# Patient Record
Sex: Male | Born: 1990 | Race: Black or African American | Hispanic: No | Marital: Single | Smoking: Current every day smoker
Health system: Southern US, Community
[De-identification: ages and names within clinical notes are randomized; demographics above are authoritative.]

## PROBLEM LIST (undated history)

## (undated) HISTORY — PX: TONSILLECTOMY: SUR1361

## (undated) HISTORY — PX: ABDOMINAL SURGERY: SHX537

---
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
## 2012-12-08 ENCOUNTER — Emergency Department: Payer: Self-pay | Admitting: Emergency Medicine

## 2013-06-21 ENCOUNTER — Emergency Department: Payer: Self-pay | Admitting: Emergency Medicine

## 2014-04-03 ENCOUNTER — Emergency Department: Payer: Self-pay | Admitting: Emergency Medicine

## 2014-07-28 ENCOUNTER — Encounter: Payer: Self-pay | Admitting: Urgent Care

## 2014-07-28 ENCOUNTER — Emergency Department
Admission: EM | Admit: 2014-07-28 | Discharge: 2014-07-28 | Disposition: A | Discharge status: Home / Self Care | Payer: No Typology Code available for payment source | Attending: Emergency Medicine | Admitting: Emergency Medicine

## 2014-07-28 DIAGNOSIS — K625 Hemorrhage of anus and rectum: Secondary | ICD-10-CM | POA: Diagnosis present

## 2014-07-28 DIAGNOSIS — Z720 Tobacco use: Secondary | ICD-10-CM | POA: Diagnosis not present

## 2014-07-28 NOTE — ED Provider Notes (Signed)
[facility] Emergency Department Provider Note ____________________________________________  Time seen: Approximately 11:16 PM  I have reviewed the triage vital signs and the nursing notes.   HISTORY  Chief Complaint Rectal Bleeding   HPI Allen Cisneros is a 24 y.o. male presents to the emergency department for one episode of rectal bleeding earlier today. He states that he was straining to have a bowel movement and when he passed stool noticed dark red blood in the toilet. He then had a small amount of light red blood on the toilet paper when he wiped. He denies having a history of rectal bleeding. He denies history of hemorrhoids. He denies abdominal or rectal pain at this time.  History reviewed. No pertinent past medical history.  There are no active problems to display for this patient.   Past Surgical History  Procedure Laterality Date  . Tonsillectomy      No current outpatient prescriptions on file.  Allergies Review of patient's allergies indicates no known allergies.  No family history on file.  Social History History  Substance Use Topics  . Smoking status: Current Every Day Smoker  . Smokeless tobacco: Not on file  . Alcohol Use: No    Review of Systems Constitutional: No fever/chills Eyes: No visual changes. ENT: No sore throat. Cardiovascular: Denies chest pain. Respiratory: Denies shortness of breath. Gastrointestinal: No abdominal pain.  No nausea, no vomiting.  No diarrhea.  Dark red blood in the toilet earlier and light red blood on the toilet paper. Genitourinary: Negative for dysuria. Musculoskeletal: Negative for back pain. Skin: Negative for rash. Neurological: Negative for headaches, focal weakness or numbness.  10-point ROS otherwise negative.  ____________________________________________   PHYSICAL EXAM:  VITAL SIGNS: ED Triage Vitals  Enc Vitals Group     BP 07/28/14 2205 128/80 mmHg     Pulse Rate  07/28/14 2205 84     Resp 07/28/14 2205 18     Temp 07/28/14 2205 98.6 F (37 C)     Temp Source 07/28/14 2205 Oral     SpO2 07/28/14 2205 99 %     Weight 07/28/14 2205 160 lb (72.576 kg)     Height 07/28/14 2205  (1.905 m)     Head Cir --      Peak Flow --      Pain Score 07/28/14 2206 0     Pain Loc --      Pain Edu? --      Excl. in GC? --     Constitutional: Alert and oriented. Well appearing and in no acute distress. Eyes: Conjunctivae are normal. PERRL. EOMI. Head: Atraumatic. Nose: No congestion/rhinnorhea. Mouth/Throat: Mucous membranes are moist.  Oropharynx non-erythematous. Neck: No stridor.   Cardiovascular: Normal rate, regular rhythm. Grossly normal heart sounds.  Good peripheral circulation. Respiratory: Normal respiratory effort.  No retractions. Lungs CTAB. Gastrointestinal: Soft and nontender. No distention. No abdominal bruits. Rectal: No external hemorrhoids. No obvious fissures. No blood present on exam.  Musculoskeletal: No lower extremity tenderness nor edema.  No joint effusions. Neurologic:  Normal speech and language. No gross focal neurologic deficits are appreciated. Speech is normal. No gait instability. Skin:  Skin is warm, dry and intact. No rash noted. Psychiatric: Mood and affect are normal. Speech and behavior are normal.  ____________________________________________   LABS (all labs ordered are listed, but only abnormal results are displayed)  Labs Reviewed - No data to display ____________________________________________  EKG   ____________________________________________  RADIOLOGY   ____________________________________________  PROCEDURES  Procedure(s) performed: Hemoccult: Negative  Critical Care performed:  ____________________________________________   INITIAL IMPRESSION / ASSESSMENT AND PLAN / ED COURSE  Pertinent labs & imaging results that were available during my care of the patient were reviewed by me and  considered in my medical decision making (see chart for details).  Patient was advised to follow up with the gastroenterologist if bleeding returns. He was advised to increase the water and fiber in his diet and to avoid straining to have a bowel movement.  ____________________________________________   FINAL CLINICAL IMPRESSION(S) / ED DIAGNOSES  Final diagnoses:  Rectal bleeding      Chinita Pester, FNP 07/28/14 0981  Maurilio Lovely, MD 07/28/14 2352

## 2014-07-28 NOTE — Discharge Instructions (Signed)
Bloody Stools  Bloody stools often mean that there is a problem in the digestive tract. Your caregiver may use the term "melena" to describe black, tarry, and bad smelling stools or "hematochezia" to describe red or maroon-colored stools. Blood seen in the stool can be caused by bleeding anywhere along the intestinal tract.   A black stool usually means that blood is coming from the upper part of the gastrointestinal tract (esophagus, stomach, or small bowel). Passing maroon-colored stools or bright red blood usually means that blood is coming from lower down in the large bowel or the rectum. However, sometimes massive bleeding in the stomach or small intestine can cause bright red bloody stools.   Consuming black licorice, lead, iron pills, medicines containing bismuth subsalicylate, or blueberries can also cause black stools. Your caregiver can test black stools to see if blood is present.  It is important that the cause of the bleeding be found. Treatment can then be started, and the problem can be corrected. Rectal bleeding may not be serious, but you should not assume everything is okay until you know the cause. It is very important to follow up with your caregiver or a specialist in gastrointestinal problems.  CAUSES   Blood in the stools can come from various underlying causes. Often, the cause is not found during your first visit. Testing is often needed to discover the cause of bleeding in the gastrointestinal tract. Causes range from simple to serious or even life-threatening. Possible causes include:  · Hemorrhoids. These are veins that are full of blood (engorged) in the rectum. They cause pain, inflammation, and may bleed.  · Anal fissures. These are areas of painful tearing which may bleed. They are often caused by passing hard stool.  · Diverticulosis. These are pouches that form on the colon over time, with age, and may bleed significantly.  · Diverticulitis. This is inflammation in areas with  diverticulosis. It can cause pain, fever, and bloody stools, although bleeding is rare.  · Proctitis and colitis. These are inflamed areas of the rectum or colon. They may cause pain, fever, and bloody stools.  · Polyps and cancer. Colon cancer is a leading cause of preventable cancer death. It often starts out as precancerous polyps that can be removed during a colonoscopy, preventing progression into cancer. Sometimes, polyps and cancer may cause rectal bleeding.  · Gastritis and ulcers. Bleeding from the upper gastrointestinal tract (near the stomach) may travel through the intestines and produce black, sometimes tarry, often bad smelling stools. In certain cases, if the bleeding is fast enough, the stools may not be black, but red and the condition may be life-threatening.  SYMPTOMS   You may have stools that are bright red and bloody, that are normal color with blood on them, or that are dark black and tarry. In some cases, you may only have blood in the toilet bowl. Any of these cases need medical care. You may also have:  · Pain at the anus or anywhere in the rectum.  · Lightheadedness or feeling faint.  · Extreme weakness.  · Nausea or vomiting.  · Fever.  DIAGNOSIS  Your caregiver may use the following methods to find the cause of your bleeding:  · Taking a medical history. Age is important. Older people tend to develop polyps and cancer more often. If there is anal pain and a hard, large stool associated with bleeding, a tear of the anus may be the cause. If blood drips into the toilet after a bowel movement, bleeding hemorrhoids may be the   problem. The color and frequency of the bleeding are additional considerations. In most cases, the medical history provides clues, but seldom the final answer.  · A visual and finger (digital) exam. Your caregiver will inspect the anal area, looking for tears and hemorrhoids. A finger exam can provide information when there is tenderness or a growth inside. In men, the  prostate is also examined.  · Endoscopy. Several types of small, long scopes (endoscopes) are used to view the colon.  ¨ In the office, your caregiver may use a rigid, or more commonly, a flexible viewing sigmoidoscope. This exam is called flexible sigmoidoscopy. It is performed in 5 to 10 minutes.  ¨ A more thorough exam is accomplished with a colonoscope. It allows your caregiver to view the entire 5 to 6 foot long colon. Medicine to help you relax (sedative) is usually given for this exam. Frequently, a bleeding lesion may be present beyond the reach of the sigmoidoscope. So, a colonoscopy may be the best exam to start with. Both exams are usually done on an outpatient basis. This means the patient does not stay overnight in the hospital or surgery center.  ¨ An upper endoscopy may be needed to examine your stomach. Sedation is used and a flexible endoscope is put in your mouth, down to your stomach.  · A barium enema X-ray. This is an X-ray exam. It uses liquid barium inserted by enema into the rectum. This test alone may not identify an actual bleeding point. X-rays highlight abnormal shadows, such as those made by lumps (tumors), diverticuli, or colitis.  TREATMENT   Treatment depends on the cause of your bleeding.   · For bleeding from the stomach or colon, the caregiver doing your endoscopy or colonoscopy may be able to stop the bleeding as part of the procedure.  · Inflammation or infection of the colon can be treated with medicines.  · Many rectal problems can be treated with creams, suppositories, or warm baths.  · Surgery is sometimes needed.  · Blood transfusions are sometimes needed if you have lost a lot of blood.  · For any bleeding problem, let your caregiver know if you take aspirin or other blood thinners regularly.  HOME CARE INSTRUCTIONS   · Take any medicines exactly as prescribed.  · Keep your stools soft by eating a diet high in fiber. Prunes (1 to 3 a day) work well for many people.  · Drink  enough water and fluids to keep your urine clear or pale yellow.  · Take sitz baths if advised. A sitz bath is when you sit in a bathtub with warm water for 10 to 15 minutes to soak, soothe, and cleanse the rectal area.  · If enemas or suppositories are advised, be sure you know how to use them. Tell your caregiver if you have problems with this.  · Monitor your bowel movements to look for signs of improvement or worsening.  SEEK MEDICAL CARE IF:   · You do not improve in the time expected.  · Your condition worsens after initial improvement.  · You develop any new symptoms.  SEEK IMMEDIATE MEDICAL CARE IF:   · You develop severe or prolonged rectal bleeding.  · You vomit blood.  · You feel weak or faint.  · You have a fever.  MAKE SURE YOU:  · Understand these instructions.  · Will watch your condition.  · Will get help right away if you are not doing well or get worse.    Document Released: 01/21/2002 Document Revised: 04/25/2011 Document Reviewed: 06/18/2010  [employer]® Patient Information ©2015 [employer]. This information is not intended to replace advice given to you by your health care provider. Make sure you discuss any questions you have with your health care provider.

## 2014-07-28 NOTE — ED Notes (Signed)
Patient presents reporting that he passed BRBPR tonight while at work. Patient advises that he was "straining real hard" to have a BM and appreciated the blood following when he wiped. Denies rectal and abd pain.

## 2014-07-28 NOTE — ED Notes (Signed)
Pt reports having a bowel movement around 7pm today and noticed BRBPR after finishing. Pt denies any recent opoid use; denies any previous issues with constipation. Pt denies any knowledge of a family history of colon cancer or hemorrhoids. Pt denies any lightheadedness or  weakness following the incident. Pt is A&O, in NAD, with s/o at bedside.

## 2014-09-05 ENCOUNTER — Other Ambulatory Visit: Payer: Self-pay

## 2014-09-05 ENCOUNTER — Emergency Department
Admission: EM | Admit: 2014-09-05 | Discharge: 2014-09-05 | Disposition: A | Discharge status: Home / Self Care | Payer: Self-pay | Attending: Emergency Medicine | Admitting: Emergency Medicine

## 2014-09-05 ENCOUNTER — Encounter: Payer: Self-pay | Admitting: Emergency Medicine

## 2014-09-05 ENCOUNTER — Emergency Department: Payer: Self-pay

## 2014-09-05 DIAGNOSIS — R079 Chest pain, unspecified: Secondary | ICD-10-CM | POA: Insufficient documentation

## 2014-09-05 DIAGNOSIS — Z720 Tobacco use: Secondary | ICD-10-CM | POA: Insufficient documentation

## 2014-09-05 LAB — CBC
HCT: 45.8 % (ref 40.0–52.0)
HEMOGLOBIN: 15.1 g/dL (ref 13.0–18.0)
MCH: 27.1 pg (ref 26.0–34.0)
MCHC: 33 g/dL (ref 32.0–36.0)
MCV: 82.1 fL (ref 80.0–100.0)
PLATELETS: 205 10*3/uL (ref 150–440)
RBC: 5.58 MIL/uL (ref 4.40–5.90)
RDW: 13.3 % (ref 11.5–14.5)
WBC: 4.5 10*3/uL (ref 3.8–10.6)

## 2014-09-05 LAB — BASIC METABOLIC PANEL
ANION GAP: 8 (ref 5–15)
BUN: 9 mg/dL (ref 6–20)
CO2: 27 mmol/L (ref 22–32)
Calcium: 9.4 mg/dL (ref 8.9–10.3)
Chloride: 102 mmol/L (ref 101–111)
Creatinine, Ser: 1.12 mg/dL (ref 0.61–1.24)
GFR calc non Af Amer: >60 mL/min (ref >60)
Glucose, Bld: 99 mg/dL (ref 65–99)
POTASSIUM: 3.9 mmol/L (ref 3.5–5.1)
Sodium: 137 mmol/L (ref 135–145)

## 2014-09-05 LAB — TROPONIN I

## 2014-09-05 MED ORDER — NAPROXEN 500 MG PO TABS: 500.0000 mg | ORAL_TABLET | Freq: Two times a day (BID) | ORAL | Status: DC

## 2014-09-05 NOTE — ED Notes (Signed)
CP typically at night but occurred yesterday while pt was at work. CP this AM, sharp

## 2014-09-05 NOTE — ED Notes (Signed)
Pt to ed with c/o left side chest pain, intermittent x 4 weeks. Pt states pain is sharp at times and he feels sob and dizzy.  Pt was seen here in ed for same about 1 month ago but states "they couldn't find anything wrong"

## 2014-09-05 NOTE — ED Notes (Signed)
Pt alert and oriented X4, active, cooperative, pt in NAD. RR even and unlabored, color WNL.  Pt informed to return if any life threatening symptoms occur.   

## 2014-09-05 NOTE — ED Notes (Signed)
Pt to ED c/o L sided chest pain that radiates to the R side of chest and to the back. Pt states he was seen in the ED earlier today.

## 2014-09-05 NOTE — ED Provider Notes (Signed)
[facility] Emergency Department Provider Note  ____________________________________________  Time seen: On arrival  I have reviewed the triage vital signs and the nursing notes.   HISTORY  Chief Complaint Chest Pain    HPI Allen Cisneros is a 24 y.o. male who reports occasional/intermittent sharp chest pain that is mild to moderate in his left breast. This has happened twice over the last month. He reports usually it happens after standing up at his job all night long. No shortness of breath. He does smoke cigarettes. He has no history of heart attacks at young ages family. No fevers no chills. No recent travel. No history of DVTs. No recent surgery.     History reviewed. No pertinent past medical history.  There are no active problems to display for this patient.   Past Surgical History  Procedure Laterality Date  . Tonsillectomy      No current outpatient prescriptions on file.  Allergies Review of patient's allergies indicates no known allergies.  History reviewed. No pertinent family history.  Social History History  Substance Use Topics  . Smoking status: Current Every Day Smoker  . Smokeless tobacco: Not on file  . Alcohol Use: No    Review of Systems  Constitutional: Negative for fever. Eyes: Negative for visual changes. ENT: Negative for sore throat Cardiovascular: Positive for chest pain Respiratory: Negative for shortness of breath. Gastrointestinal: Negative for abdominal pain, vomiting and diarrhea. Genitourinary: Negative for dysuria. Musculoskeletal: Negative for back pain. Skin: Negative for rash. Neurological: Negative for headaches or focal weakness Psychiatric: No anxiety  10-point ROS otherwise negative.  ____________________________________________   PHYSICAL EXAM:  VITAL SIGNS: ED Triage Vitals  Enc Vitals Group     BP 09/05/14 0835 150/94 mmHg     Pulse Rate 09/05/14 0835 51     Resp 09/05/14 0835  20     Temp 09/05/14 0835 97.8 F (36.6 C)     Temp Source 09/05/14 0835 Oral     SpO2 09/05/14 0835 100 %     Weight 09/05/14 0835 155 lb (70.308 kg)     Height 09/05/14 0835  (1.905 m)     Head Cir --      Peak Flow --      Pain Score 09/05/14 0836 3     Pain Loc --      Pain Edu? --      Excl. in GC? --      Constitutional: Alert and oriented. Well appearing and in no distress. Eyes: Conjunctivae are normal.  ENT   Head: Normocephalic and atraumatic.   Mouth/Throat: Mucous membranes are moist. Cardiovascular: Normal rate, regular rhythm. Normal and symmetric distal pulses are present in all extremities. No murmurs, rubs, or gallops. Respiratory: Normal respiratory effort without tachypnea nor retractions. Breath sounds are clear and equal bilaterally.  Gastrointestinal: Soft and non-tender in all quadrants. No distention. There is no CVA tenderness. Genitourinary: deferred Musculoskeletal: Nontender with normal range of motion in all extremities. No lower extremity tenderness nor edema. Neurologic:  Normal speech and language. No gross focal neurologic deficits are appreciated. Skin:  Skin is warm, dry and intact. No rash noted. Psychiatric: Mood and affect are normal. Patient exhibits appropriate insight and judgment.  ____________________________________________    LABS (pertinent positives/negatives)  Labs Reviewed - No data to display  ____________________________________________   EKG  ED ECG REPORT I, Jene Every, the attending physician, personally viewed and interpreted this ECG.   Date: 09/05/2014  EKG Time:  8:35 AM  Rate: 51  Rhythm: sinus bradycardia  Axis: Normal  Intervals:none  ST&T Change: None   ____________________________________________    RADIOLOGY I have personally reviewed any xrays that were ordered on this patient: None ____________________________________________   PROCEDURES  Procedure(s) performed:  none  Critical Care performed: none  ____________________________________________   INITIAL IMPRESSION / ASSESSMENT AND PLAN / ED COURSE  Pertinent labs & imaging results that were available during my care of the patient were reviewed by me and considered in my medical decision making (see chart for details).  Patient well-appearing and in no distress. He is quite comfortable in the emergency department. 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 His history of present illness is not consistent with ACS nor a DVT. He is perc negative. He has mild hypertension for which he will follow-up with his primary care physician. I do not feel blood work is necessary or will add anything to his care today. Have asked him follow-up with his PCP as mentioned  ____________________________________________   FINAL CLINICAL IMPRESSION(S) / ED DIAGNOSES  Final diagnoses:  Chest pain, unspecified chest pain type     Jene Every, MD 09/05/14 1500

## 2014-09-05 NOTE — Discharge Instructions (Signed)

## 2014-09-05 NOTE — ED Notes (Signed)
MD at bedside. 

## 2014-09-06 ENCOUNTER — Emergency Department
Admission: EM | Admit: 2014-09-06 | Discharge: 2014-09-06 | Disposition: A | Discharge status: Home / Self Care | Payer: Self-pay | Attending: Emergency Medicine | Admitting: Emergency Medicine

## 2014-09-06 DIAGNOSIS — R079 Chest pain, unspecified: Secondary | ICD-10-CM

## 2014-09-06 LAB — FIBRIN DERIVATIVES D-DIMER (ARMC ONLY): FIBRIN DERIVATIVES D-DIMER (ARMC): 99 (ref 0–499)

## 2014-09-06 NOTE — ED Provider Notes (Signed)
[facility] Emergency Department Provider Note  Time seen: 5:20 AM  I have reviewed the triage vital signs and the nursing notes.   HISTORY  Chief Complaint Chest Pain    HPI Allen Cisneros is a 24 y.o. male with no past medical history who presents the emergency department for intermittent chest pain times one month. According to the patient for the past one month he has intermittently had chest pain, she describes as sharp and central. Patient states occasionally worse when he takes a deep breath or cough. Denies any leg swelling or pain. Denies any history of blood clots. The patient was seen for a similar presentation yesterday, but no labs were done at that time. Patient was follow up with his primary care doctor, but he states he has continued to have intermittent chest pain so he came to the ER for evaluation again.Describes his chest and a sharp, moderate when it does occur, but it is intermittent, he is largely pain free between episodes.    No past medical history on file.  There are no active problems to display for this patient.   Past Surgical History  Procedure Laterality Date  . Tonsillectomy      Current Outpatient Rx  Name  Route  Sig  Dispense  Refill  . acetaminophen (TYLENOL) 500 MG tablet   Oral   Take 500 mg by mouth every 6 (six) hours as needed for mild pain.         . naproxen (NAPROSYN) 500 MG tablet   Oral   Take 1 tablet (500 mg total) by mouth 2 (two) times daily with a meal.   20 tablet   2     Allergies Review of patient's allergies indicates no known allergies.  No family history on file.  Social History History  Substance Use Topics  . Smoking status: Current Every Day Smoker  . Smokeless tobacco: Not on file  . Alcohol Use: No    Review of Systems Constitutional: Negative for fever. Cardiovascular: Positive for chest pain, intermittent Respiratory: Negative for shortness of  breath. Gastrointestinal: Negative for abdominal pain, vomiting and diarrhea. Neurological: Negative for headache 10-point ROS otherwise negative.  ____________________________________________   PHYSICAL EXAM:  VITAL SIGNS: ED Triage Vitals  Enc Vitals Group     BP 09/05/14 2250 128/78 mmHg     Pulse Rate 09/05/14 2250 53     Resp 09/05/14 2250 18     Temp 09/05/14 2250 97.6 F (36.4 C)     Temp Source 09/05/14 2250 Oral     SpO2 09/05/14 2250 99 %     Weight 09/05/14 2250 150 lb (68.04 kg)     Height 09/05/14 2250  (1.905 m)     Head Cir --      Peak Flow --      Pain Score 09/05/14 2251 7     Pain Loc --      Pain Edu? --      Excl. in GC? --     Constitutional: Alert and oriented. Well appearing and in no distress. ENT   Mouth/Throat: Mucous membranes are moist. Cardiovascular: Normal rate, regular rhythm, bradycardic at times. Patient does have a constant cardiac murmur. Respiratory: Normal respiratory effort without tachypnea nor retractions. Breath sounds are clear and equal bilaterally. No wheezes/rales/rhonchi. Gastrointestinal: Soft and nontender. No distention.  Musculoskeletal: Nontender with normal range of motion in all extremities. No lower extremity tenderness or edema. Neurologic:  Normal speech  and language. No gross focal neurologic deficits Skin:  Skin is warm, dry and intact.  Psychiatric: Mood and affect are normal. Speech and behavior are normal.   ____________________________________________    EKG  EKG reviewed and interpreted by myself shows normal sinus rhythm at 59 bpm, narrow QRS, rightward axis, normal intervals, patient does have nonspecific ST changes present, but no ST elevations noted.  ____________________________________________    RADIOLOGY  Chest x-ray within normal limits  ____________________________________________    INITIAL IMPRESSION / ASSESSMENT AND PLAN / ED COURSE  Pertinent labs & imaging results that  were available during my care of the patient were reviewed by me and considered in my medical decision making (see chart for details).  Patient presents for intermittent chest pain times close to one month. Patient is labs including cardiac enzyme is within normal limits. EKG is abnormal, but no acute/concerning findings. Chest x-ray is normal. Given the patient's central chest pain, we'll proceed with a d-dimer to help further evaluate. The patient does have a fairly constant murmur. He states he is aware of this, and he had an ultrasound of his heart done approximately 4-5 years ago, but he cannot remember the name of the cardiologist. He states he was told that it was nothing to worry about.  D-dimer is negative. I discussed this with the patient, and the need for him to follow up with a local cardiologist. We will refer to Dr.Khan. Patient is agreeable to follow up with cardiology. Discussed with the patient strict chest pain return precautions to which he is agreeable.  ____________________________________________   FINAL CLINICAL IMPRESSION(S) / ED DIAGNOSES  Chest pain   Minna Antis, MD 09/06/14 [phone]

## 2014-09-06 NOTE — Discharge Instructions (Signed)
You have been seen in the emergency department today for chest pain. Your workup including EKG, and labs do not show any acute abnormalities. As we have discussed you do have a murmur on exam, and we recommend you follow-up with a cardiologist by calling the number provided as soon as possible for further evaluation and workup. Return to the emergency department if you have any further/increased chest pain, trouble breathing, or any other symptom personally concerning to yourself.    Chest Pain (Nonspecific) It is often hard to give a diagnosis for the cause of chest pain. There is always a chance that your pain could be related to something serious, such as a heart attack or a blood clot in the lungs. You need to follow up with your doctor. HOME CARE  If antibiotic medicine was given, take it as directed by your doctor. Finish the medicine even if you start to feel better.  For the next few days, avoid activities that bring on chest pain. Continue physical activities as told by your doctor.  Do not use any tobacco products. This includes cigarettes, chewing tobacco, and e-cigarettes.  Avoid drinking alcohol.  Only take medicine as told by your doctor.  Follow your doctor's suggestions for more testing if your chest pain does not go away.  Keep all doctor visits you made. GET HELP IF:  Your chest pain does not go away, even after treatment.  You have a rash with blisters on your chest.  You have a fever. GET HELP RIGHT AWAY IF:   You have more pain or pain that spreads to your arm, neck, jaw, back, or belly (abdomen).  You have shortness of breath.  You cough more than usual or cough up blood.  You have very bad back or belly pain.  You feel sick to your stomach (nauseous) or throw up (vomit).  You have very bad weakness.  You pass out (faint).  You have chills. This is an emergency. Do not wait to see if the problems will go away. Call your local emergency services (911  in [place] Do not drive yourself to the hospital. MAKE SURE YOU:   Understand these instructions.  Will watch your condition.  Will get help right away if you are not doing well or get worse. Document Released: 07/20/2007 Document Revised: 02/05/2013 Document Reviewed: 07/20/2007 [facility] Patient Information 2015 [place], [place]. This information is not intended to replace advice given to you by your health care provider. Make sure you discuss any questions you have with your health care provider.

## 2014-10-23 ENCOUNTER — Emergency Department
Admission: EM | Admit: 2014-10-23 | Discharge: 2014-10-23 | Disposition: A | Discharge status: Home / Self Care | Payer: Self-pay | Attending: Emergency Medicine | Admitting: Emergency Medicine

## 2014-10-23 ENCOUNTER — Encounter: Payer: Self-pay | Admitting: Emergency Medicine

## 2014-10-23 DIAGNOSIS — Z720 Tobacco use: Secondary | ICD-10-CM | POA: Insufficient documentation

## 2014-10-23 DIAGNOSIS — R350 Frequency of micturition: Secondary | ICD-10-CM | POA: Insufficient documentation

## 2014-10-23 DIAGNOSIS — Z791 Long term (current) use of non-steroidal anti-inflammatories (NSAID): Secondary | ICD-10-CM | POA: Insufficient documentation

## 2014-10-23 LAB — URINALYSIS COMPLETE WITH MICROSCOPIC (ARMC ONLY)
Bacteria, UA: NONE SEEN
Bilirubin Urine: NEGATIVE
Glucose, UA: NEGATIVE mg/dL
Hgb urine dipstick: NEGATIVE
Ketones, ur: NEGATIVE mg/dL
Leukocytes, UA: NEGATIVE
Nitrite: NEGATIVE
PROTEIN: NEGATIVE mg/dL
SQUAMOUS EPITHELIAL / LPF: NONE SEEN
Specific Gravity, Urine: 1.004 — ABNORMAL LOW (ref 1.005–1.030)
pH: 6 (ref 5.0–8.0)

## 2014-10-23 LAB — CHLAMYDIA/NGC RT PCR (ARMC ONLY)
CHLAMYDIA TR: NOT DETECTED
N gonorrhoeae: NOT DETECTED

## 2014-10-23 NOTE — ED Notes (Signed)
Patient to ED with report of urinary frequency over the last few weeks. Denies pain or burning and reports a normal urine stream.

## 2014-10-23 NOTE — ED Provider Notes (Signed)
[facility] Emergency Department Provider Note  ____________________________________________  Time seen: Approximately 9:14 AM  I have reviewed the triage vital signs and the nursing notes.   HISTORY  Chief Complaint Urinary Frequency  HPI Allen Cisneros is a 24 y.o. male is here with complaint of urinary frequency over the last few weeks. Patient denies any pain or burning. He states his stream is normal. He gives a history of several weeks ago being treated for gonorrhea. Both he and his girlfriend retreated. He denies any discharge. He denies any history of diabetes or kidney stones. He denies any fever or chills, no nausea or vomiting. Currently his pain is 0 out of 10. He states that he has been working outside, has been drinking a lot of fluid which may explain his frequency. He denies any prostate problems in the past and currently denies any back pain.  History reviewed. No pertinent past medical history.  There are no active problems to display for this patient.   Past Surgical History  Procedure Laterality Date  . Tonsillectomy      Current Outpatient Rx  Name  Route  Sig  Dispense  Refill  . acetaminophen (TYLENOL) 500 MG tablet   Oral   Take 500 mg by mouth every 6 (six) hours as needed for mild pain.         . naproxen (NAPROSYN) 500 MG tablet   Oral   Take 1 tablet (500 mg total) by mouth 2 (two) times daily with a meal.   20 tablet   2     Allergies Review of patient's allergies indicates no known allergies.  History reviewed. No pertinent family history.  Social History Social History  Substance Use Topics  . Smoking status: Current Every Day Smoker  . Smokeless tobacco: None  . Alcohol Use: No    Review of Systems Constitutional: No fever/chills Eyes: No visual changes. ENT: No sore throat. Cardiovascular: Denies chest pain. Respiratory: Denies shortness of breath. Gastrointestinal: No abdominal pain.  No nausea,  no vomiting.   Genitourinary: Positive for frequency Musculoskeletal: Negative for back pain. Skin: Negative for rash. Neurological: Negative for headaches, focal weakness or numbness.  10-point ROS otherwise negative.  ____________________________________________   PHYSICAL EXAM:  VITAL SIGNS: ED Triage Vitals  Enc Vitals Group     BP 10/23/14 0756 131/85 mmHg     Pulse Rate 10/23/14 0756 54     Resp 10/23/14 0756 18     Temp 10/23/14 0756 98.4 F (36.9 C)     Temp Source 10/23/14 0756 Oral     SpO2 10/23/14 0756 100 %     Weight 10/23/14 0756 165 lb (74.844 kg)     Height 10/23/14 0756  (1.905 m)     Head Cir --      Peak Flow --      Pain Score --      Pain Loc --      Pain Edu? --      Excl. in GC? --     Constitutional: Alert and oriented. Well appearing and in no acute distress. Eyes: Conjunctivae are normal. PERRL. EOMI. Head: Atraumatic. Nose: No congestion/rhinnorhea. Neck: No stridor.   Cardiovascular: Normal rate, regular rhythm. Grossly normal heart sounds.  Good peripheral circulation. Respiratory: Normal respiratory effort.  No retractions. Lungs CTAB. Gastrointestinal: Soft and nontender. No distention.  No CVA tenderness. Musculoskeletal: No lower extremity tenderness nor edema.  No joint effusions. Neurologic:  Normal speech and  language. No gross focal neurologic deficits are appreciated. No gait instability. Skin:  Skin is warm, dry and intact. No rash noted. Psychiatric: Mood and affect are normal. Speech and behavior are normal.  ____________________________________________   LABS (all labs ordered are listed, but only abnormal results are displayed)  Labs Reviewed  URINALYSIS COMPLETEWITH MICROSCOPIC ([facility] ONLY) - Abnormal; Notable for the following:    Color, Urine STRAW (*)    APPearance CLEAR (*)    Specific Gravity, Urine 1.004 (*)    All other components within normal limits  CHLAMYDIA/NGC RT PCR ([facility] ONLY)     PROCEDURES  Procedure(s) performed: None  Critical Care performed: No  ____________________________________________   INITIAL IMPRESSION / ASSESSMENT AND PLAN / ED COURSE  Pertinent labs & imaging results that were available during my care of the patient were reviewed by me and considered in my medical decision making (see chart for details).  Patient was advised that his gonorrhea chlamydia test will not be back for several hours. We will call if the results are positive. ____________________________________________   FINAL CLINICAL IMPRESSION(S) / ED DIAGNOSES  Final diagnoses:  Urinary frequency      Tommi Rumps, PA-C 10/23/14 1006  Arnaldo Natal, MD 10/23/14 [phone]

## 2014-10-23 NOTE — Discharge Instructions (Signed)

## 2014-10-24 ENCOUNTER — Telehealth: Payer: Self-pay | Admitting: Emergency Medicine

## 2014-10-24 NOTE — ED Notes (Signed)
pateint calleda asking for urine test results.  i explained what was tested and that the tests for gc and chlamydia are negative.  i asked about plans for follow up and pt does not have pcp.  He was referred to [facility] and i explained.  He says he used to got to [facility].  i told him he could call there to see if they are taking patients.

## 2016-10-11 ENCOUNTER — Emergency Department
Admission: EM | Admit: 2016-10-11 | Discharge: 2016-10-11 | Disposition: A | Discharge status: Home / Self Care | Payer: Self-pay | Attending: Emergency Medicine | Admitting: Emergency Medicine

## 2016-10-11 ENCOUNTER — Encounter: Payer: Self-pay | Admitting: Emergency Medicine

## 2016-10-11 DIAGNOSIS — M62830 Muscle spasm of back: Secondary | ICD-10-CM

## 2016-10-11 DIAGNOSIS — M549 Dorsalgia, unspecified: Secondary | ICD-10-CM

## 2016-10-11 DIAGNOSIS — F17200 Nicotine dependence, unspecified, uncomplicated: Secondary | ICD-10-CM | POA: Insufficient documentation

## 2016-10-11 DIAGNOSIS — Z79899 Other long term (current) drug therapy: Secondary | ICD-10-CM | POA: Insufficient documentation

## 2016-10-11 MED ORDER — CYCLOBENZAPRINE HCL 10 MG PO TABS: 10.0000 mg | ORAL_TABLET | Freq: Three times a day (TID) | ORAL | 0 refills | Status: AC | PRN

## 2016-10-11 MED ORDER — KETOROLAC TROMETHAMINE 10 MG PO TABS: 10.0000 mg | ORAL_TABLET | Freq: Four times a day (QID) | ORAL | 0 refills | Status: AC | PRN

## 2016-10-11 MED ORDER — KETOROLAC TROMETHAMINE 30 MG/ML IJ SOLN
30.0000 mg | Freq: Once | INTRAMUSCULAR | Status: AC
  Administered 2016-10-11: 30 mg via INTRAMUSCULAR
  Filled 2016-10-11: qty 1

## 2016-10-11 NOTE — Discharge Instructions (Signed)
Take medication as prescribed. Return to emergency department if symptoms worsen and follow-up with PCP as needed.    Avoid heavy lifting for the next 24-48 hours until majority of symptoms improve.

## 2016-10-11 NOTE — ED Notes (Signed)
See triage note  States he developed pain to upper back on Saturday.  states he is unsure of injury but he has moved this weekend  Ambulates well

## 2016-10-11 NOTE — ED Provider Notes (Signed)
[facility] Emergency Department Provider Note   ____________________________________________   I have reviewed the triage vital signs and the nursing notes.   HISTORY  Chief Complaint Back Pain    HPI Allen Cisneros is a 26 y.o. male presents emergency Department with upper back pain he localizes over bilateral scapula that began to worsen this past Saturday. Patient reports he is moving from his home, in addition to, performing lifting, pushing and pulling during his work shift at the [employer]. Patient stated after getting off work this morning the pain had worsened to the point he felt he needed to come to the emergency department. Patient describes pain as constant along musculature around bilateral scapula with spasming and cramping. Patient denies fever, chills, headache, vision changes, chest pain, chest tightness, shortness of breath, abdominal pain, nausea and vomiting.  History reviewed. No pertinent past medical history.  There are no active problems to display for this patient.   Past Surgical History:  Procedure Laterality Date  . TONSILLECTOMY      Prior to Admission medications   Medication Sig Start Date End Date Taking? Authorizing Provider  acetaminophen (TYLENOL) 500 MG tablet Take 500 mg by mouth every 6 (six) hours as needed for mild pain.    [provider]  cyclobenzaprine (FLEXERIL) 10 MG tablet Take 1 tablet (10 mg total) by mouth 3 (three) times daily as needed for muscle spasms. 10/11/16   Lady Wisham M, PA-C  ketorolac (TORADOL) 10 MG tablet Take 1 tablet (10 mg total) by mouth every 6 (six) hours as needed. 10/11/16 10/16/16  Orissa Arreaga M, PA-C  naproxen (NAPROSYN) 500 MG tablet Take 1 tablet (500 mg total) by mouth 2 (two) times daily with a meal. 09/05/14   Jene Every, MD    Allergies Patient has no known allergies.  History reviewed. No pertinent family history.  Social History Social  History  Substance Use Topics  . Smoking status: Current Every Day Smoker  . Smokeless tobacco: Never Used  . Alcohol use No    Review of Systems Constitutional: Negative for fever/chills Eyes: No visual changes. ENT:  Negative for sore throat and for difficulty swallowing Cardiovascular: Denies chest pain. Respiratory: Denies cough. Denies shortness of breath. Gastrointestinal: No abdominal pain.  No nausea, vomiting, diarrhea. Genitourinary: Negative for dysuria. Musculoskeletal: Positive for mid back pain. Skin: Negative for rash. Neurological: Negative for headaches.  Negative focal weakness or numbness. Negative for loss of consciousness. Able to ambulate. ____________________________________________   PHYSICAL EXAM:  VITAL SIGNS: ED Triage Vitals  Enc Vitals Group     BP 10/11/16 0740 132/80     Pulse Rate 10/11/16 0739 87     Resp 10/11/16 0739 16     Temp 10/11/16 0739 98.2 F (36.8 C)     Temp Source 10/11/16 0739 Oral     SpO2 10/11/16 0739 100 %     Weight 10/11/16 0739 170 lb (77.1 kg)     Height 10/11/16 0739 6\' 3"  (1.905 m)     Head Circumference --      Peak Flow --      Pain Score 10/11/16 0739 8     Pain Loc --      Pain Edu? --      Excl. in GC? --     Constitutional: Alert and oriented. Well appearing and in no acute distress.  Eyes: Conjunctivae are normal. PERRL. EOMI  Head: Normocephalic and atraumatic. ENT:  Ears: Canals clear. TMs intact bilaterally.      Nose: No congestion/rhinnorhea.      Mouth/Throat: Mucous membranes are moist.  Neck:Supple. No thyromegaly. No stridor.  Cardiovascular: Normal rate, regular rhythm. Normal S1 and S2.  Good peripheral circulation. Respiratory: Normal respiratory effort without tachypnea or retractions. Lungs CTAB. No wheezes/rales/rhonchi. Good air entry to the bases with no decreased or absent breath sounds. Hematological/Lymphatic/Immunological: No cervical lymphadenopathy. Cardiovascular: Normal  rate, regular rhythm. Normal distal pulses. Gastrointestinal: Bowel sounds 4 quadrants. Soft and nontender to palpation. No guarding or rigidity. No palpable masses. No distention. No CVA tenderness. Musculoskeletal: Muscle pain and muscle spasms along bilateral trapezius, teres, rhomboids. Cervical and thoracic spine range of motion intact. No tenderness along spinous processes of the cervical or thoracic spine. No radicular symptoms reported. Intact sensation and strength along the bilateral upper extremity's. Nontender with normal range of motion in all extremities. Neurologic: Normal speech and language.  Skin:  Skin is warm, dry and intact. No rash noted. Psychiatric: Mood and affect are normal. Speech and behavior are normal. Patient exhibits appropriate insight and judgement.  ____________________________________________   LABS (all labs ordered are listed, but only abnormal results are displayed)  Labs Reviewed - No data to display ____________________________________________  EKG None ____________________________________________  RADIOLOGY None ____________________________________________   PROCEDURES  Procedure(s) performed: no    Critical Care performed: no ____________________________________________   INITIAL IMPRESSION / ASSESSMENT AND PLAN / ED COURSE  Pertinent labs & imaging results that were available during my care of the patient were reviewed by me and considered in my medical decision making (see chart for details).  Patient presents to emergency department with mid back pain for 3 days that has progressed with repetitive lifting of light to moderate heavy objects.. History and physical exam findings are reassuring symptoms are consistent with mild muscle strain and muscle spasms. Patient noted improvement of symptoms following Toradol given during the course of care in the emergency department. Patient will be prescribed a 5 day course of Toradol and  Flexeril as needed for muscle spasms. Patient advised to follow up with PCP as needed or return to the emergency department if symptoms return or worsen. Patient informed of clinical course, understand medical decision-making process, and agree with plan.   ____________________________________________   FINAL CLINICAL IMPRESSION(S) / ED DIAGNOSES  Final diagnoses:  Muscle spasm of back  Mid-back pain, acute       NEW MEDICATIONS STARTED DURING THIS VISIT:  New Prescriptions   CYCLOBENZAPRINE (FLEXERIL) 10 MG TABLET    Take 1 tablet (10 mg total) by mouth 3 (three) times daily as needed for muscle spasms.   KETOROLAC (TORADOL) 10 MG TABLET    Take 1 tablet (10 mg total) by mouth every 6 (six) hours as needed.     Note:  This document was prepared using Dragon voice recognition software and may include unintentional dictation errors.    Clois Comber, PA-C 10/11/16 2956    Emily Filbert, MD 10/11/16 0930

## 2016-10-11 NOTE — ED Triage Notes (Signed)
Pt c/o upper back pain Saturday.  Working has made pain worse, lifts heavy objects.  Pain worse with raising arms and when turns head.  Ambulatory to triage without difficulty.

## 2016-10-14 ENCOUNTER — Encounter: Payer: Self-pay | Admitting: Emergency Medicine

## 2016-10-14 ENCOUNTER — Emergency Department
Admission: EM | Admit: 2016-10-14 | Discharge: 2016-10-14 | Disposition: A | Discharge status: Home / Self Care | Payer: Self-pay | Attending: Emergency Medicine | Admitting: Emergency Medicine

## 2016-10-14 DIAGNOSIS — J069 Acute upper respiratory infection, unspecified: Secondary | ICD-10-CM | POA: Insufficient documentation

## 2016-10-14 DIAGNOSIS — F17200 Nicotine dependence, unspecified, uncomplicated: Secondary | ICD-10-CM | POA: Insufficient documentation

## 2016-10-14 LAB — POCT RAPID STREP A: Streptococcus, Group A Screen (Direct): NEGATIVE

## 2016-10-14 MED ORDER — CETIRIZINE HCL 10 MG PO CAPS: 10.0000 mg | ORAL_CAPSULE | Freq: Every day | ORAL | 3 refills | Status: AC

## 2016-10-14 MED ORDER — GUAIFENESIN-CODEINE 100-10 MG/5ML PO SOLN: 10.0000 mL | Freq: Three times a day (TID) | ORAL | 0 refills | Status: AC | PRN

## 2016-10-14 NOTE — ED Provider Notes (Signed)
[facility] Emergency Department Provider Note  ____________________________________________  Time seen: Approximately 8:53 AM  I have reviewed the triage vital signs and the nursing notes.   HISTORY  Chief Complaint Sore Throat    HPI Allen Cisneros is a 26 y.o. male who presents to the emergency department for evaluation and treatment of sore throat, cough, and runny nose for the past 2 weeks. He has only taken some type of pill that his sister gave him but is unsure of what that was exactly and states that it did not help him. He states he had a fever 2 days ago but none since that time. 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  History reviewed. No pertinent past medical history.  There are no active problems to display for this patient.   Past Surgical History:  Procedure Laterality Date  . TONSILLECTOMY      Prior to Admission medications   Medication Sig Start Date End Date Taking? Authorizing Provider  acetaminophen (TYLENOL) 500 MG tablet Take 500 mg by mouth every 6 (six) hours as needed for mild pain.    [provider]  Cetirizine HCl 10 MG CAPS Take 1 capsule (10 mg total) by mouth daily. 10/14/16   Milind Raether, Rulon Eisenmengerari B, FNP  cyclobenzaprine (FLEXERIL) 10 MG tablet Take 1 tablet (10 mg total) by mouth 3 (three) times daily as needed for muscle spasms. 10/11/16   Little, Traci M, PA-C  guaiFENesin-codeine 100-10 MG/5ML syrup Take 10 mLs by mouth 3 (three) times daily as needed. 10/14/16   Marcellius Montagna, Rulon Eisenmengerari B, FNP  ketorolac (TORADOL) 10 MG tablet Take 1 tablet (10 mg total) by mouth every 6 (six) hours as needed. 10/11/16 10/16/16  Little, Traci M, PA-C  naproxen (NAPROSYN) 500 MG tablet Take 1 tablet (500 mg total) by mouth 2 (two) times daily with a meal. 09/05/14   Jene EveryKinner, Robert, MD    Allergies Patient has no known allergies.  History reviewed. No pertinent family history.  Social History Social History   Substance Use Topics  . Smoking status: Current Every Day Smoker  . Smokeless tobacco: Never Used  . Alcohol use No    Review of Systems Constitutional: Negative for fever. Eyes: No visual changes. ENT: Positive for sore throat; negative for difficulty swallowing. Respiratory: Denies shortness of breath. Gastrointestinal: No abdominal pain.  No nausea, no vomiting.  No diarrhea.  Genitourinary: Negative for dysuria. Musculoskeletal: Negative for generalized body aches. Skin: Negative for rash. Neurological: Negative for headaches, negative  focal weakness or numbness.  ____________________________________________   PHYSICAL EXAM:  VITAL SIGNS: ED Triage Vitals  Enc Vitals Group     BP 10/14/16 0823 (!) 141/74     Pulse Rate 10/14/16 0823 77     Resp 10/14/16 0823 18     Temp 10/14/16 0823 97.8 F (36.6 C)     Temp Source 10/14/16 0823 Oral     SpO2 10/14/16 0823 100 %     Weight 10/14/16 0824 170 lb (77.1 kg)     Height 10/14/16 0824 6\' 3"  (1.905 m)     Head Circumference --      Peak Flow --      Pain Score 10/14/16 0823 10     Pain Loc --      Pain Edu? --      Excl. in GC? --    Constitutional: Alert and oriented. Well appearing and in no acute distress. Eyes: Conjunctivae  are normal.  Head: Atraumatic. Nose: No congestion/rhinnorhea. Mouth/Throat: Mucous membranes are moist.  Oropharynx Erythematous, tonsils 1+ without exudate. Uvula is midline. Neck: No stridor.  Lymphatic: Lymphadenopathy: No palpable anterior cervical lymphadenopathy Cardiovascular: Normal rate, regular rhythm. Good peripheral circulation. Respiratory: Normal respiratory effort. Lungs CTAB. Gastrointestinal: Soft and nontender. Musculoskeletal: No lower extremity tenderness nor edema.  Neurologic:  Normal speech and language. No gross focal neurologic deficits are appreciated. Speech is normal. No gait instability. Skin:  Skin is warm, dry and intact. No rash noted Psychiatric: Mood and  affect are normal. Speech and behavior are normal.  ____________________________________________   LABS (all labs ordered are listed, but only abnormal results are displayed)  Labs Reviewed  POCT RAPID STREP A   ____________________________________________  EKG  Not indicated ____________________________________________  RADIOLOGY  Not indicated ____________________________________________   PROCEDURES  Procedure(s) performed: None  Critical Care performed: No ____________________________________________   INITIAL IMPRESSION / ASSESSMENT AND PLAN / ED COURSE  26 year old male presenting to the emergency department after 2 weeks of sore throat, cough, and runny nose. Symptoms most consistent with URI. He will be treated with guaifenesin with codeine and encouraged to increase his fluid intake, rest, and follow-up with the primary care provider of his choice for symptoms that are not improving over the next few days. He was instructed to return to the emergency department for symptoms that change or worsen if he is unable to schedule an appointment.  Pertinent labs & imaging results that were available during my care of the patient were reviewed by me and considered in my medical decision making (see chart for details). ____________________________________________  New Prescriptions   CETIRIZINE HCL 10 MG CAPS    Take 1 capsule (10 mg total) by mouth daily.   GUAIFENESIN-CODEINE 100-10 MG/5ML SYRUP    Take 10 mLs by mouth 3 (three) times daily as needed.    FINAL CLINICAL IMPRESSION(S) / ED DIAGNOSES  Final diagnoses:  Viral upper respiratory tract infection    If controlled substance prescribed during this visit, 12 month history viewed on the [facility] prior to issuing an initial prescription for Schedule II or III opiod.   Note:  This document was prepared using Dragon voice recognition software and may include unintentional dictation errors.    Chinita Pester,  FNP 10/14/16 1610    Jene Every, MD 10/14/16 1146

## 2016-10-14 NOTE — ED Triage Notes (Signed)
Patient arrives to [facility] ED with c/o sore throat and cough for 2 weeks. +nasal drainage

## 2016-10-14 NOTE — Discharge Instructions (Signed)
Return to the ER for symptoms that change or worsen if unable to schedule an appointment with the primary care provider of your choice.

## 2016-10-14 NOTE — ED Notes (Signed)
Pt. Throat was red and slightly inflamed.

## 2016-12-04 ENCOUNTER — Emergency Department
Admission: EM | Admit: 2016-12-04 | Discharge: 2016-12-04 | Disposition: A | Discharge status: Home / Self Care | Payer: Self-pay | Attending: Emergency Medicine | Admitting: Emergency Medicine

## 2016-12-04 DIAGNOSIS — F17200 Nicotine dependence, unspecified, uncomplicated: Secondary | ICD-10-CM | POA: Insufficient documentation

## 2016-12-04 DIAGNOSIS — K0889 Other specified disorders of teeth and supporting structures: Secondary | ICD-10-CM

## 2016-12-04 DIAGNOSIS — K047 Periapical abscess without sinus: Secondary | ICD-10-CM | POA: Insufficient documentation

## 2016-12-04 DIAGNOSIS — Z79899 Other long term (current) drug therapy: Secondary | ICD-10-CM | POA: Insufficient documentation

## 2016-12-04 MED ORDER — CHLORHEXIDINE GLUCONATE 0.12% ORAL RINSE (MEDLINE KIT): 15.0000 mL | Freq: Two times a day (BID) | OROMUCOSAL | 0 refills | Status: AC

## 2016-12-04 MED ORDER — LIDOCAINE VISCOUS 2 % MT SOLN
15.0000 mL | Freq: Once | OROMUCOSAL | Status: AC
  Administered 2016-12-04: 15 mL via OROMUCOSAL
  Filled 2016-12-04: qty 15

## 2016-12-04 MED ORDER — KETOROLAC TROMETHAMINE 30 MG/ML IJ SOLN
30.0000 mg | Freq: Once | INTRAMUSCULAR | Status: AC
  Administered 2016-12-04: 30 mg via INTRAMUSCULAR
  Filled 2016-12-04: qty 1

## 2016-12-04 MED ORDER — IBUPROFEN 800 MG PO TABS: 800.0000 mg | ORAL_TABLET | Freq: Three times a day (TID) | ORAL | 0 refills | Status: AC | PRN

## 2016-12-04 MED ORDER — AMOXICILLIN 500 MG PO TABS: 500.0000 mg | ORAL_TABLET | Freq: Three times a day (TID) | ORAL | 0 refills | Status: AC

## 2016-12-04 NOTE — ED Provider Notes (Signed)
[facility] Emergency Department Provider Note   ____________________________________________   I have reviewed the triage vital signs and the nursing notes.   HISTORY  Chief Complaint Dental Pain    HPI Allen Cisneros is a 26 y.o. male presents emergency Department with left lower jaw dental pain with associated erythema and swelling along the gumline, equal mucosa and external cheek area. Patient noted onset of symptoms earlier last night have continued through the day today. Patient denies any recent dental trauma and he is unsure if he sustained a broken tooth or lost a filling. Patient does not tend a dentist regularly and he does not know the last time he went to the dentist. Patient denies any fever or chills associated with current symptoms and he denies nausea vomiting diarrhea or headache. Patient denies vision changes, chest pain, chest tightness, shortness of breath or abdominal pain.  History reviewed. No pertinent past medical history.  There are no active problems to display for this patient.   Past Surgical History:  Procedure Laterality Date  . ABDOMINAL SURGERY    . TONSILLECTOMY      Prior to Admission medications   Medication Sig Start Date End Date Taking? Authorizing Provider  acetaminophen (TYLENOL) 500 MG tablet Take 500 mg by mouth every 6 (six) hours as needed for mild pain.    [provider]  amoxicillin (AMOXIL) 500 MG tablet Take 1 tablet (500 mg total) by mouth 3 (three) times daily. 12/04/16   Amberlea Spagnuolo M, PA-C  Cetirizine HCl 10 MG CAPS Take 1 capsule (10 mg total) by mouth daily. 10/14/16   Triplett, Johnette Abraham B, FNP  chlorhexidine gluconate, MEDLINE KIT, (PERIDEX) 0.12 % solution Use as directed 15 mLs in the mouth or throat 2 (two) times daily. 12/04/16   Marcella Dunnaway M, PA-C  cyclobenzaprine (FLEXERIL) 10 MG tablet Take 1 tablet (10 mg total) by mouth 3 (three) times daily as needed for muscle spasms.  10/11/16   Nachelle Negrette M, PA-C  guaiFENesin-codeine 100-10 MG/5ML syrup Take 10 mLs by mouth 3 (three) times daily as needed. 10/14/16   Triplett, Cari B, FNP  ibuprofen (ADVIL,MOTRIN) 800 MG tablet Take 1 tablet (800 mg total) by mouth every 8 (eight) hours as needed. 12/04/16   Wilmina Maxham M, PA-C  naproxen (NAPROSYN) 500 MG tablet Take 1 tablet (500 mg total) by mouth 2 (two) times daily with a meal. 09/05/14   Lavonia Drafts, MD    Allergies Patient has no known allergies.  No family history on file.  Social History Social History  Substance Use Topics  . Smoking status: Current Every Day Smoker  . Smokeless tobacco: Never Used  . Alcohol use No    Review of Systems Constitutional: Negative for fever/chills Eyes: No visual changes. ENT:  Positive for left lower dental pain with swelling along gumline and buccal mucosa.  Cardiovascular: Denies chest pain. Respiratory: Denies cough. Denies shortness of breath. Gastrointestinal: No abdominal pain.  No nausea, vomiting, diarrhea. Skin: Negative for rash. Neurological: Negative for headaches.  ____________________________________________   PHYSICAL EXAM:  VITAL SIGNS: ED Triage Vitals [12/04/16 1910]  Enc Vitals Group     BP (!) 155/94     Pulse Rate (!) 51     Resp 15     Temp 98.6 F (37 C)     Temp Source Oral     SpO2 99 %     Weight 165 lb (74.8 kg)     Height 6'  3" (1.905 m)     Head Circumference      Peak Flow      Pain Score 10     Pain Loc      Pain Edu?      Excl. in [place]?     Constitutional: Alert and oriented. Well appearing and in no acute distress.  Eyes: Conjunctivae are normal. PERRL. EOMI  Head: Normocephalic and atraumatic. ENT:.      Mouth/Throat: Mucous membranes are moist. Oropharynx normal. Left lower gumline and buccal mucosa erythematous and edematous. Left lower premolar broken with significant dental caries in proximity of erythema and edema. External swelling noted along the left  lower jaw line without induration.  Cardiovascular: Normal rate, regular rhythm.  Respiratory: Normal respiratory effort without tachypnea or retractions.  Hematological/Lymphatic/Immunological: No cervical lymphadenopathy. Neurologic: Normal speech and language.  Skin:  Skin is warm, dry and intact. No rash noted. Psychiatric: Mood and affect are normal. Speech and behavior are normal. Patient exhibits appropriate insight and judgement.  ____________________________________________   LABS (all labs ordered are listed, but only abnormal results are displayed)  Labs Reviewed - No data to display ____________________________________________  EKG none ____________________________________________  RADIOLOGY none ____________________________________________   PROCEDURES  Procedure(s) performed: no    Critical Care performed: no ____________________________________________   INITIAL IMPRESSION / ASSESSMENT AND PLAN / ED COURSE  Pertinent labs & imaging results that were available during my care of the patient were reviewed by me and considered in my medical decision making (see chart for details).   Patient presents to emergency department with left lower jaw dental pain that began early last night. History and physical exam findings are consistent with left lower jaw dental abscess. In the area of associated symptoms along broken left lower jaw premolar with significant dental caries. Patient will be prescribed amoxillicin, chlorhexidine and ibuprofen. Patient advised to follow up with a dental clinic for continue care or return to the emergency department if symptoms return or worsen. Patient informed of clinical course, understand medical decision-making process, and agree with plan.  _______   FINAL CLINICAL IMPRESSION(S) / ED DIAGNOSES  Final diagnoses:  Pain, dental  Dental abscess       NEW MEDICATIONS STARTED DURING THIS VISIT:  New Prescriptions    AMOXICILLIN (AMOXIL) 500 MG TABLET    Take 1 tablet (500 mg total) by mouth 3 (three) times daily.   CHLORHEXIDINE GLUCONATE, MEDLINE KIT, (PERIDEX) 0.12 % SOLUTION    Use as directed 15 mLs in the mouth or throat 2 (two) times daily.   IBUPROFEN (ADVIL,MOTRIN) 800 MG TABLET    Take 1 tablet (800 mg total) by mouth every 8 (eight) hours as needed.     Note:  This document was prepared using Dragon voice recognition software and may include unintentional dictation errors.    Jerolyn Shin, PA-C 12/04/16 [place], [place], MD 12/07/16 [phone]

## 2016-12-04 NOTE — Discharge Instructions (Signed)
OPTIONS FOR DENTAL FOLLOW UP CARE ° °[place] Department of [facility] and Human Services - Local Safety Net Dental Clinics °http://www.ncdhhs.gov/dph/oralhealth/services/safetynetclinics.htm °  °[facility] (336-562-3123) ° °[facility] [place] (919-933-9087) ° °[facility] ([phone] ext [phone]) ° °[facility] ([phone]) ° °[facility] ([phone]) °This clinic caters to the indigent population and is on a lottery system. °Location: °[facility], [address], [place] °Clinic Hours: °Wednesdays from 6pm - 9pm, patients seen by a lottery system. °For dates, call or go to www.med.[facility].edu/[facility]/patients/Dental-[facility] °Services: °Cleanings, fillings and simple extractions. °Payment Options: °DENTAL WORK IS FREE OF CHARGE. Bring proof of income or support. °Best way to get seen: °Arrive at 5:15 pm - this is a lottery, NOT first come/first serve, so arriving earlier will not increase your chances of being seen. °  °  °[facility] °[phone] °Select option 1 for emergencies °  °Location: °[facility], [address], [place] °Clinic Hours: °No walk-ins accepted - call the day before to schedule an appointment. °Check in times are 9:30 am and 1:30 pm. °Services: °Simple extractions, temporary fillings, pulpectomy/pulp debridement, uncomplicated abscess drainage. °Payment Options: °PAYMENT IS DUE AT THE TIME OF SERVICE.  Fee is usually $100-200, additional surgical procedures (e.g. abscess drainage) may be extra. °Cash, checks, Visa/MasterCard accepted.  Can file Medicaid if patient is covered for dental - patient should call case worker to check. °No discount for [facility] patients. °Best way to get seen: °MUST call the day before and get onto the schedule. Can usually be seen the next 1-2 days. No walk-ins accepted. °  °  °[facility] °[phone] °   °Location: °[facility], [address], [place] °Clinic Hours: °M, W, Th, F 8am or 1:30pm, Tues 9a or 1:30 - first come/first served. °Services: °Simple extractions, temporary fillings, uncomplicated abscess drainage.  You do not need to be an [place] resident. °Payment Options: °PAYMENT IS DUE AT THE TIME OF SERVICE. °Dental insurance, otherwise sliding scale - bring proof of income or support. °Depending on income and treatment needed, cost is usually $50-200. °Best way to get seen: °Arrive early as it is first come/first served. °  °  °[facility] °[phone] °  °Location: °[address] °Clinic Hours: °Mon-Thu 8a-5p °Services: °Most basic dental services including extractions and fillings. °Payment Options: °PAYMENT IS DUE AT THE TIME OF SERVICE. °Sliding scale, up to 50% off - bring proof if income or support. °Medicaid with dental option accepted. °Best way to get seen: °Call to schedule an appointment, can usually be seen within 2 weeks OR they will try to see walk-ins - show up at 8a or 2p (you may have to wait). °  °  °[facility] °[phone] °[place] RESIDENTS ONLY °  °Location: °[facility], [address], [place], [place] [address] °Clinic Hours: By appointment only. °Monday - Thursday 8am-5pm, Friday 8am-12pm °Services: Cleanings, fillings, extractions. °Payment Options: °PAYMENT IS DUE AT THE TIME OF SERVICE. °Cash, Visa or MasterCard. Sliding scale - $30 minimum per service. °Best way to get seen: °Come in to office, complete packet and make an appointment - need proof of income °or support monies for each household member and proof of [place] residence. °Usually takes about a month to get in. °  °  °[facility] °[phone] °  °Location: °[address]   [place] °Clinic Hours: Walk-in Urgent Care Dental Services are offered Monday-Friday  mornings only. °The numbers of emergencies accepted daily is limited to the number of °providers available. °Maximum 15 - Mondays, Wednesdays & Thursdays °Maximum 10 - Tuesdays & Fridays °Services: °You do not need to be a [place] resident to be seen for a dental emergency. °Emergencies are defined as pain, swelling, abnormal bleeding, or dental trauma. Walkins will receive x-rays if needed. °NOTE: Dental cleaning is not an emergency. °Payment Options: °PAYMENT IS DUE AT THE TIME OF SERVICE. °Minimum co-pay is $40.00 for uninsured patients. °Minimum co-pay is $3.00 for Medicaid with dental coverage. °Dental Insurance is accepted and must be presented at time of visit. °Medicare does not cover dental. °Forms of payment: Cash, credit card, checks. °Best way to get seen: °If not previously registered with the clinic, walk-in dental registration begins at 7:15 am and is on a first come/first serve basis. °If previously registered with the clinic, call to make an appointment. °  °  °[facility] °[phone] °[place] RESIDENTS ONLY °  °Location: °[address], [place], [place] °Clinic Hours: °Mon-Thu 10a-2p °Services: Extractions only! °Payment Options: °FREE (donations accepted) - bring proof of income or support °Best way to get seen: °Call and schedule an appointment OR come at 8am on the 1st Monday of every month (except for holidays) when it is first come/first served. °  °  °[facility] °[phone] °  °Location: °[address], [place] °Clinic Hours: °Friday mornings °Services, Payment Options, Best way to get seen: °Call for info °

## 2016-12-04 NOTE — ED Notes (Signed)
Reviewed d/c instructions, follow-up care, prescriptions with patient. Pt verbalized understanding.  

## 2016-12-04 NOTE — ED Triage Notes (Signed)
Patient c/o dental pain/swelling beginning today. Patient has visible swelling to left face. Patient rates pain at 10 out of 10 and describes pain as an ache.

## 2017-02-12 ENCOUNTER — Emergency Department
Admission: EM | Admit: 2017-02-12 | Discharge: 2017-02-12 | Disposition: A | Discharge status: Home / Self Care | Payer: Self-pay | Attending: Emergency Medicine | Admitting: Emergency Medicine

## 2017-02-12 ENCOUNTER — Encounter: Payer: Self-pay | Admitting: Emergency Medicine

## 2017-02-12 ENCOUNTER — Other Ambulatory Visit: Payer: Self-pay

## 2017-02-12 ENCOUNTER — Emergency Department: Payer: Self-pay

## 2017-02-12 DIAGNOSIS — R079 Chest pain, unspecified: Secondary | ICD-10-CM | POA: Insufficient documentation

## 2017-02-12 DIAGNOSIS — Z79899 Other long term (current) drug therapy: Secondary | ICD-10-CM | POA: Insufficient documentation

## 2017-02-12 DIAGNOSIS — F17210 Nicotine dependence, cigarettes, uncomplicated: Secondary | ICD-10-CM | POA: Insufficient documentation

## 2017-02-12 LAB — BASIC METABOLIC PANEL
ANION GAP: 7 (ref 5–15)
BUN: 16 mg/dL (ref 6–20)
CALCIUM: 9.4 mg/dL (ref 8.9–10.3)
CO2: 30 mmol/L (ref 22–32)
Chloride: 102 mmol/L (ref 101–111)
Creatinine, Ser: 1.06 mg/dL (ref 0.61–1.24)
GFR calc Af Amer: >60 mL/min (ref >60)
GFR calc non Af Amer: >60 mL/min (ref >60)
GLUCOSE: 105 mg/dL — AB (ref 65–99)
POTASSIUM: 3.5 mmol/L (ref 3.5–5.1)
Sodium: 139 mmol/L (ref 135–145)

## 2017-02-12 LAB — CBC
HEMATOCRIT: 45 % (ref 40.0–52.0)
HEMOGLOBIN: 15 g/dL (ref 13.0–18.0)
MCH: 27.5 pg (ref 26.0–34.0)
MCHC: 33.3 g/dL (ref 32.0–36.0)
MCV: 82.5 fL (ref 80.0–100.0)
Platelets: 229 10*3/uL (ref 150–440)
RBC: 5.46 MIL/uL (ref 4.40–5.90)
RDW: 13.7 % (ref 11.5–14.5)
WBC: 4.4 10*3/uL (ref 3.8–10.6)

## 2017-02-12 LAB — TROPONIN I: Troponin I: <0.03 ng/mL (ref <0.03)

## 2017-02-12 NOTE — ED Provider Notes (Signed)
[facility] Emergency Department Provider Note  Time seen: 10:39 AM  I have reviewed the triage vital signs and the nursing notes.   HISTORY  Chief Complaint Chest Pain    HPI Allen Cisneros is a 26 y.o. male with no past medical history who presents to the emergency department for left-sided chest pain.  Patient states since yesterday he has been experiencing intermittent left-sided chest pain.  States it has been an ongoing issue for years for him but worse over the past 2 days.  Patient denies any nausea or vomiting.  Denies diaphoresis.  Denies trouble breathing.  States the pain is sharp and moderate when it occurs and then will resolve completely in between episodes.  No history of cardiac disease per patient.  Patient has been seen in the emergency department previously by myself for similar chest pain, admits that he never followed up with cardiology.   History reviewed. No pertinent past medical history.  There are no active problems to display for this patient.   Past Surgical History:  Procedure Laterality Date  . ABDOMINAL SURGERY    . TONSILLECTOMY      Prior to Admission medications   Medication Sig Start Date End Date Taking? Authorizing Provider  acetaminophen (TYLENOL) 500 MG tablet Take 500 mg by mouth every 6 (six) hours as needed for mild pain.    [provider]  amoxicillin (AMOXIL) 500 MG tablet Take 1 tablet (500 mg total) by mouth 3 (three) times daily. 12/04/16   Little, Allen M, PA-C  Cetirizine HCl 10 MG CAPS Take 1 capsule (10 mg total) by mouth daily. 10/14/16   Allen Cisneros, Allen Abraham B, FNP  chlorhexidine gluconate, MEDLINE KIT, (PERIDEX) 0.12 % solution Use as directed 15 mLs in the mouth or throat 2 (two) times daily. 12/04/16   Little, Allen M, PA-C  cyclobenzaprine (FLEXERIL) 10 MG tablet Take 1 tablet (10 mg total) by mouth 3 (three) times daily as needed for muscle spasms. 10/11/16   Little, Allen M, PA-C   guaiFENesin-codeine 100-10 MG/5ML syrup Take 10 mLs by mouth 3 (three) times daily as needed. 10/14/16   Allen Cisneros, Allen B, FNP  ibuprofen (ADVIL,MOTRIN) 800 MG tablet Take 1 tablet (800 mg total) by mouth every 8 (eight) hours as needed. 12/04/16   Little, Allen M, PA-C  naproxen (NAPROSYN) 500 MG tablet Take 1 tablet (500 mg total) by mouth 2 (two) times daily with a meal. 09/05/14   Allen Drafts, MD    No Known Allergies  History reviewed. No pertinent family history.  Social History Social History   Tobacco Use  . Smoking status: Current Every Day Smoker    Packs/day: 0.25    Types: Cigarettes  . Smokeless tobacco: Never Used  Substance Use Topics  . Alcohol use: No  . Drug use: No    Review of Systems Constitutional: Negative for fever. Eyes: Negative for visual changes. ENT: Negative for congestion Cardiovascular: Intermittent left chest pain Respiratory: Negative for shortness of breath. Gastrointestinal: Negative for abdominal pain, vomiting Genitourinary: Negative for dysuria. Musculoskeletal: Negative for back pain. Skin: Negative for rash. Neurological: Negative for headache All other ROS negative  ____________________________________________   PHYSICAL EXAM:  VITAL SIGNS: ED Triage Vitals  Enc Vitals Group     BP 02/12/17 1002 132/80     Pulse Rate 02/12/17 1002 (!) 56     Resp 02/12/17 1002 14     Temp 02/12/17 1002 98.1 F (36.7 C)  Temp Source 02/12/17 1002 Oral     SpO2 02/12/17 1002 100 %     Weight 02/12/17 1002 160 lb (72.6 kg)     Height 02/12/17 1002 6' 3"  (1.905 Cisneros)     Head Circumference --      Peak Flow --      Pain Score 02/12/17 1006 8     Pain Loc --      Pain Edu? --      Excl. in [place]? --     Constitutional: Alert and oriented. Well appearing and in no distress. Eyes: Normal exam ENT   Head: Normocephalic and atraumatic.   Mouth/Throat: Mucous membranes are moist. Cardiovascular: Normal rate, regular rhythm. No  murmur Respiratory: Normal respiratory effort without tachypnea nor retractions. Breath sounds are clear.  Mild left chest wall tenderness to palpation. Gastrointestinal: Soft and nontender. No distention.   Musculoskeletal: Nontender with normal range of motion in all extremities. No lower extremity tenderness or edema. Neurologic:  Normal speech and language. No gross focal neurologic deficits  Skin:  Skin is warm, dry and intact.  Psychiatric: Mood and affect are normal. Speech and behavior are normal.   ____________________________________________    EKG  EKG reviewed and interpreted by myself shows sinus bradycardia 57 bpm with a narrow QRS, normal axis, normal intervals, no ST changes.  Normal EKG.  ____________________________________________    RADIOLOGY  Chest x-ray negative  ____________________________________________   INITIAL IMPRESSION / ASSESSMENT AND PLAN / ED COURSE  Pertinent labs & imaging results that were available during my care of the patient were reviewed by me and considered in my medical decision making (see chart for details).  Patient presents to the emergency department for intermittent chest pain worse since yesterday although ongoing intermittently times years.  Differential would include chest wall pain, tietze syndrome/costochondritis, less likely ACS or PE.  I reviewed the patient's records, I have previously seen the patient for the same with a negative workup including negative d-dimer.  Today the patient is PERC negative.  Patient states he has not followed up with cardiology for a stress test.  I urged the patient to call cardiology to arrange a stress test he is agreeable.  Currently the patient appears well, no distress, lying in bed comfortably.  Chest pain is mildly reproducible on examination.  We will check labs chest x-ray and continue closely monitor.  EKG is normal.  Labs are within normal limits including a negative troponin.  Chest  x-ray pending.  Chest x-ray is negative.  We will discharge patient home with cardiology follow-up.  Discussed my normal chest pain return precautions.    ____________________________________________   FINAL CLINICAL IMPRESSION(S) / ED DIAGNOSES  chest pain    Harvest Dark, MD 02/12/17 1102

## 2017-02-12 NOTE — ED Notes (Signed)
Pt denies any cough or cold sxs.

## 2017-02-12 NOTE — ED Triage Notes (Signed)
Pt reports left side chest pain that started last night. Pt has hx of left side chest pain pt states the pain is better and intermittent.  Pt states the pain radiated to the back last night.    Pt denies any medication for pain.

## 2017-02-12 NOTE — Discharge Instructions (Signed)
You have been seen in the emergency department today for chest pain. Your workup has shown normal results. As we discussed please follow-up with your primary care physician in the next 1-2 days for recheck. Return to the emergency department for any further chest pain, trouble breathing, or any other symptom personally concerning to yourself. °

## 2017-02-12 NOTE — ED Notes (Signed)
Pt ambulatory to POV without difficulty. VSS. Discharge instructions and follow up reviewed with patient. No questions or concerns voiced.

## 2018-06-05 IMAGING — CR DG CHEST 2V
1 series · 3 of 3 positions shown · non-contrast
Comparison: 09/05/2014

CLINICAL DATA: 26-year-old male with a history of chest pain

EXAM:
CHEST  2 VIEW

[Series 1: dg chest 2 view · 0.14mm/px · 3 of 3 slices shown]
[im 1/3]
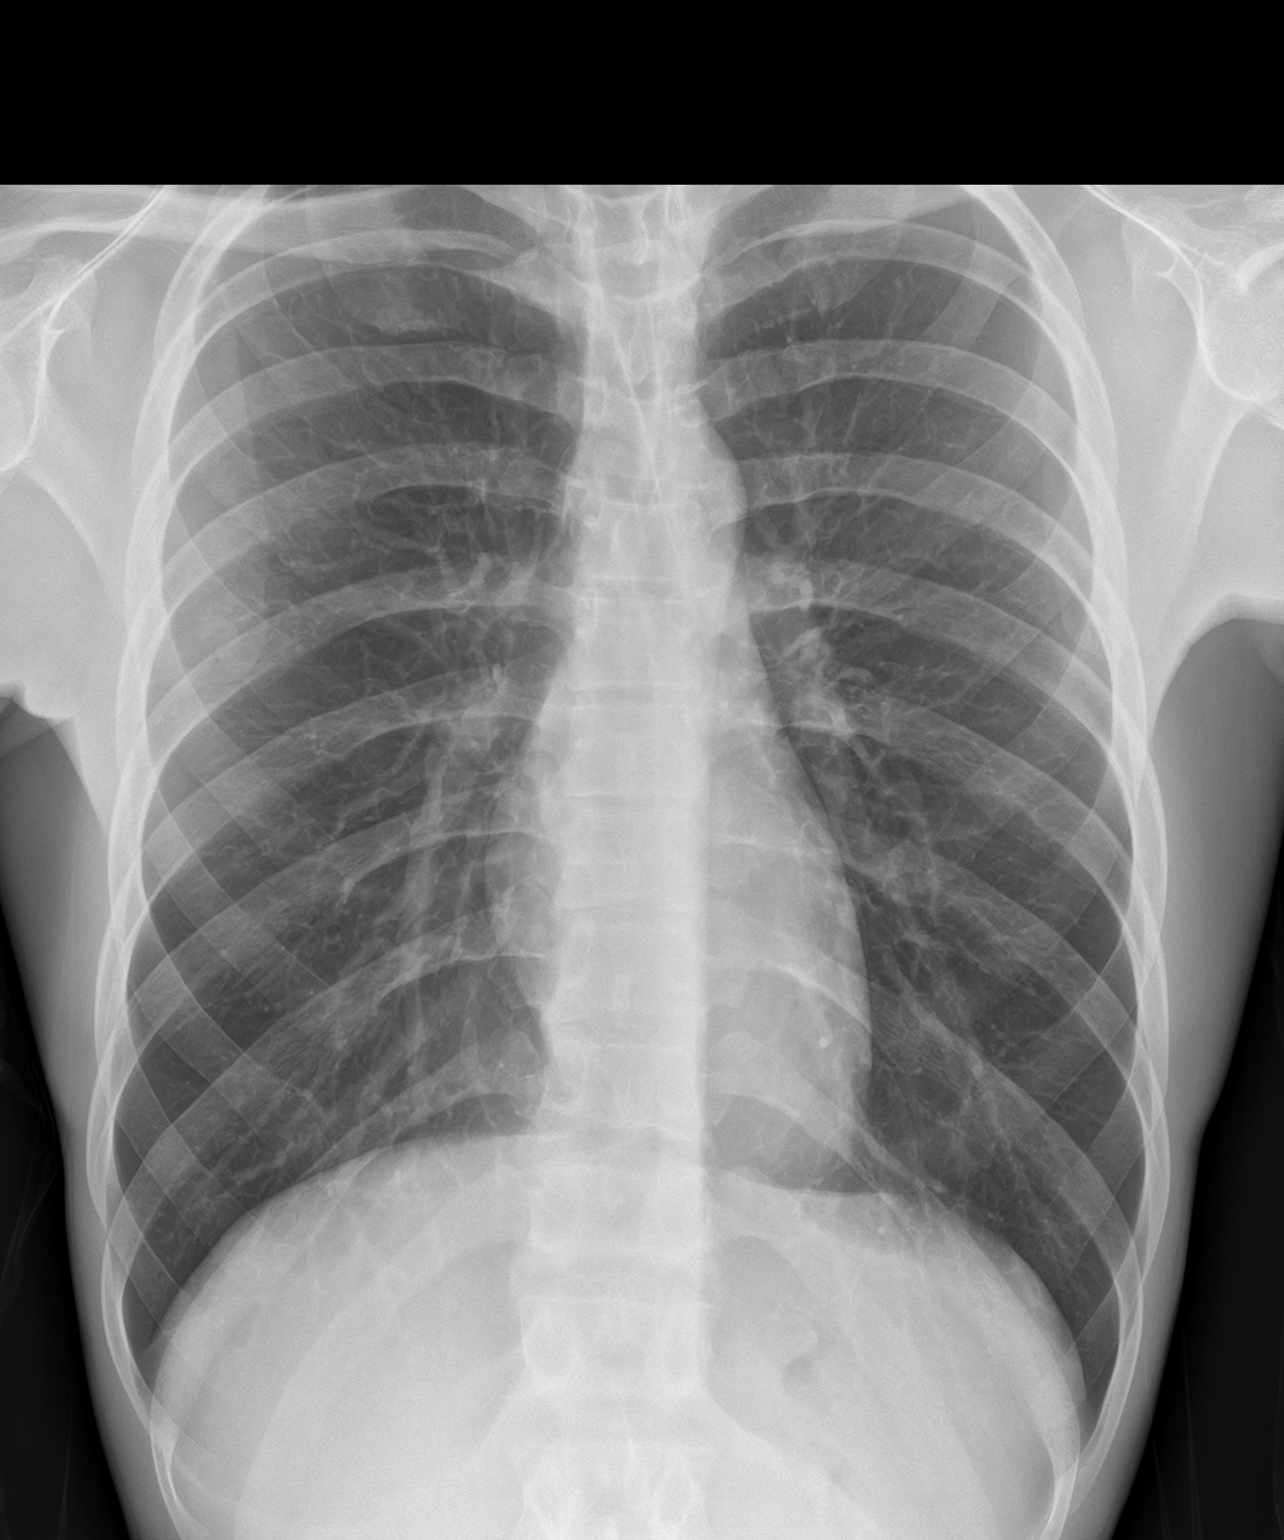
[im 2/3]
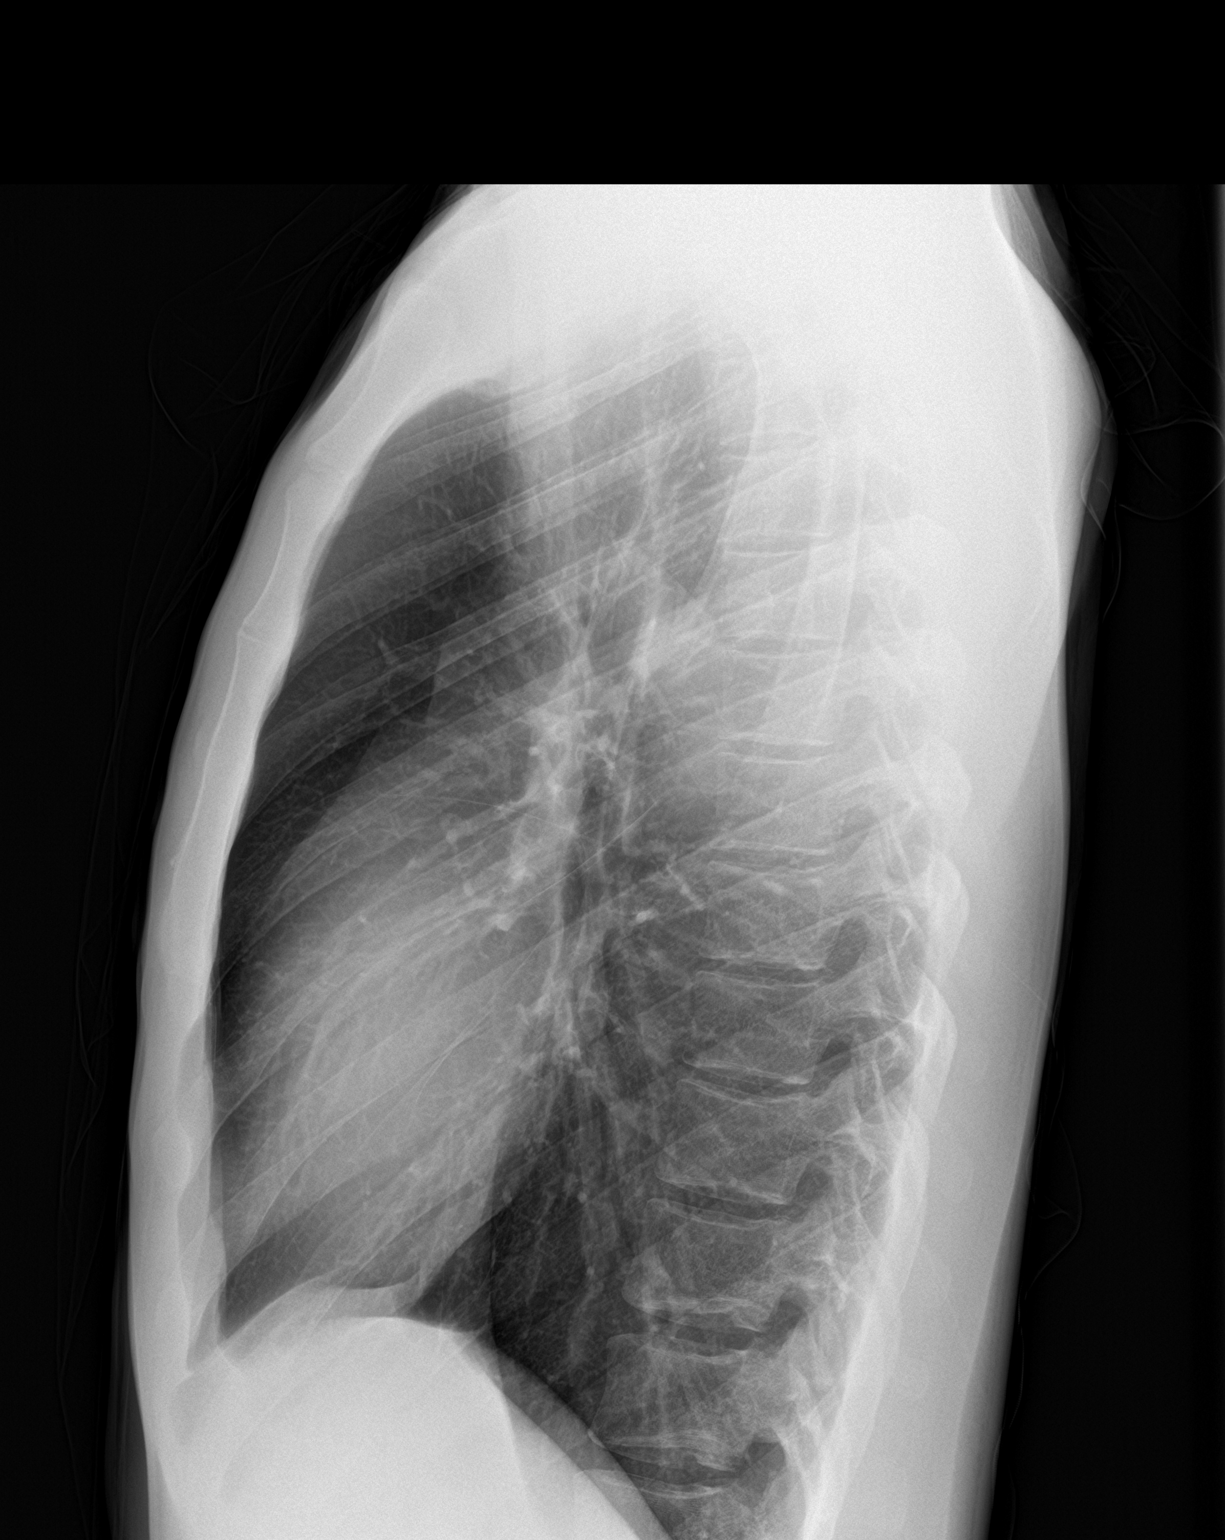
[im 3/3]
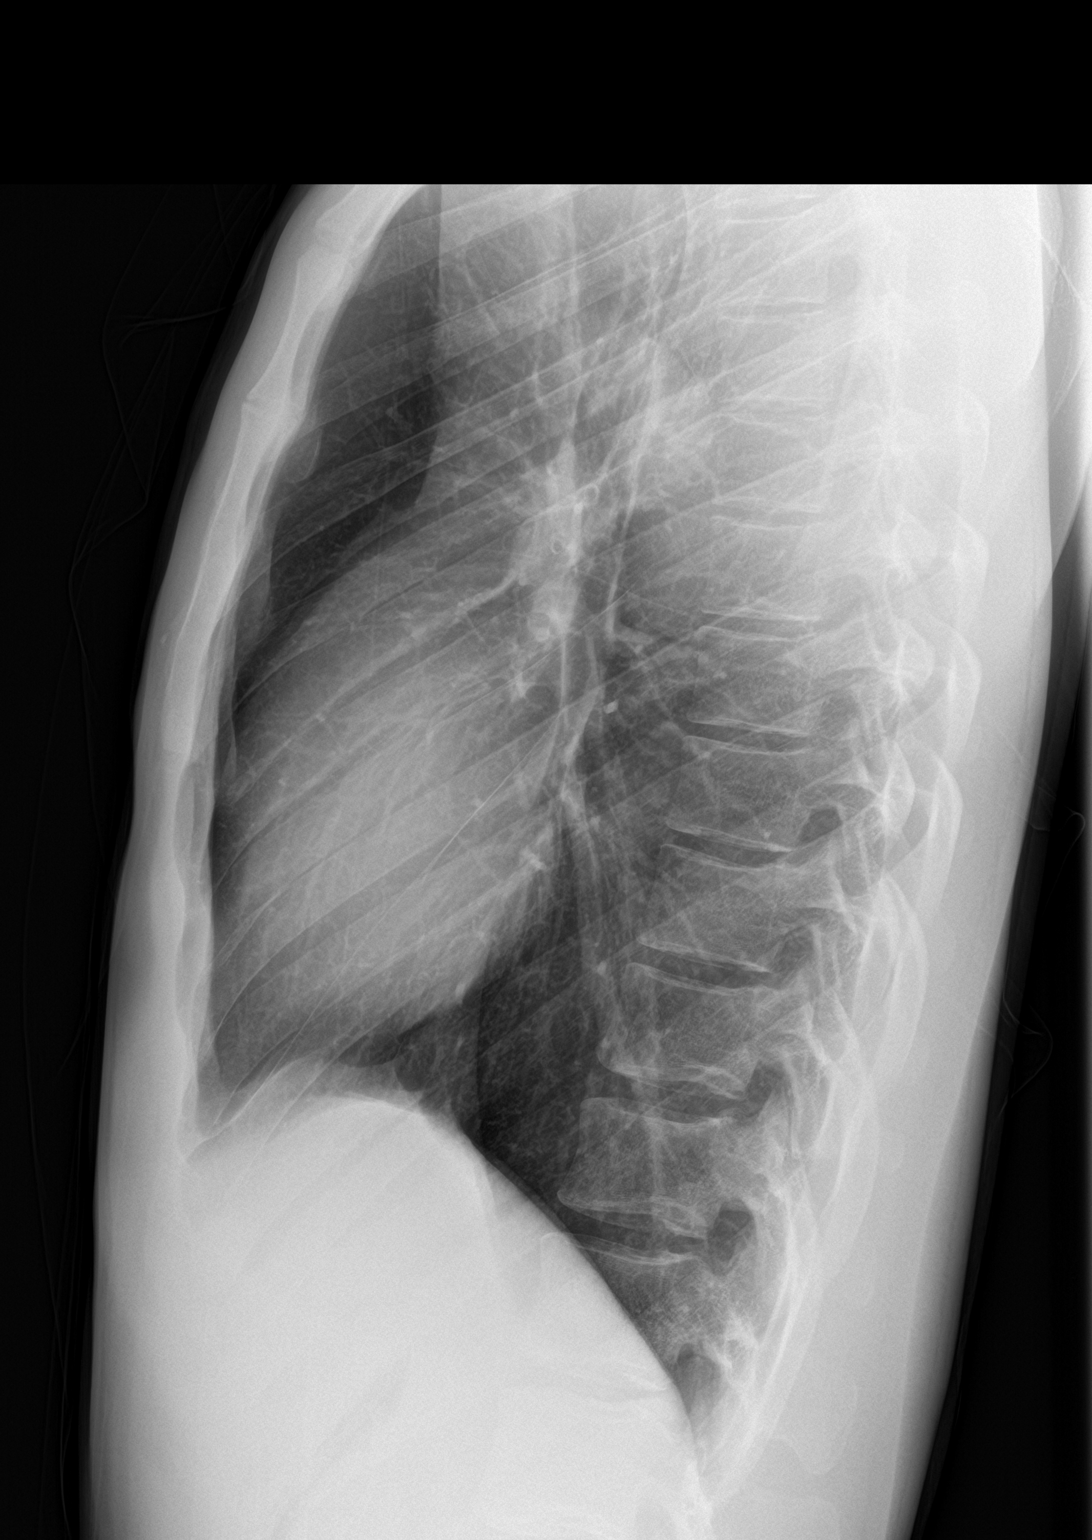

[3 of 3 positions shown; findings below may reference images not displayed]

FINDINGS: The heart size and mediastinal contours are within normal limits.
Both lungs are clear. The visualized skeletal structures are
unremarkable.
IMPRESSION: No radiographic evidence of acute cardiopulmonary disease
